# Patient Record
Sex: Male | Born: 1995 | Race: Black or African American | Hispanic: No | Marital: Single | Smoking: Never smoker
Health system: Southern US, Community
[De-identification: ages and names within clinical notes are randomized; demographics above are authoritative.]

---
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
## 2017-01-11 IMAGING — CR DG ANKLE COMPLETE 3+V*R*
3 series · 3 of 3 positions shown · non-contrast
Comparison: 12/20/2014

CLINICAL DATA: 19-year-old male with a history of right ankle
fracture previously.

EXAM:
RIGHT ANKLE - COMPLETE 3+ VIEW

[ankle ap]
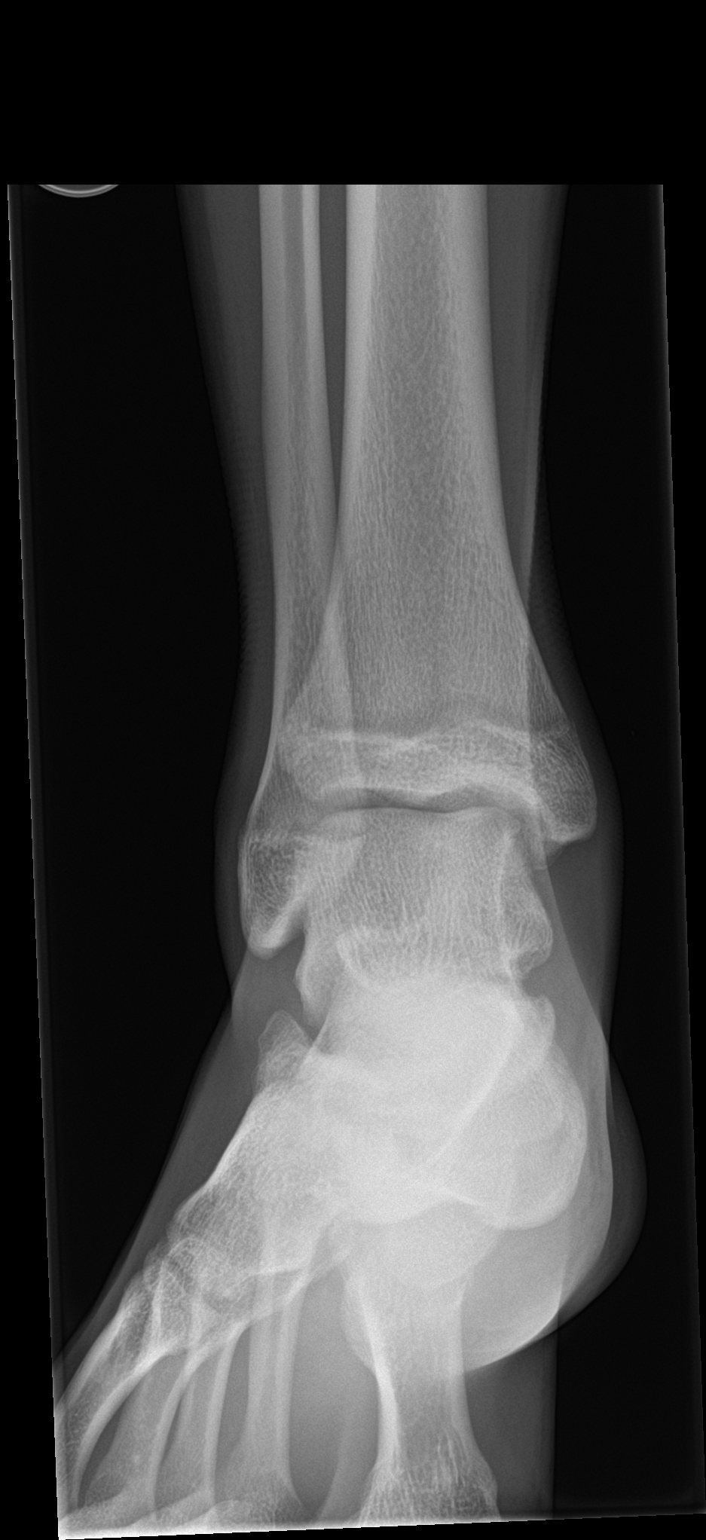

[ankle obl]
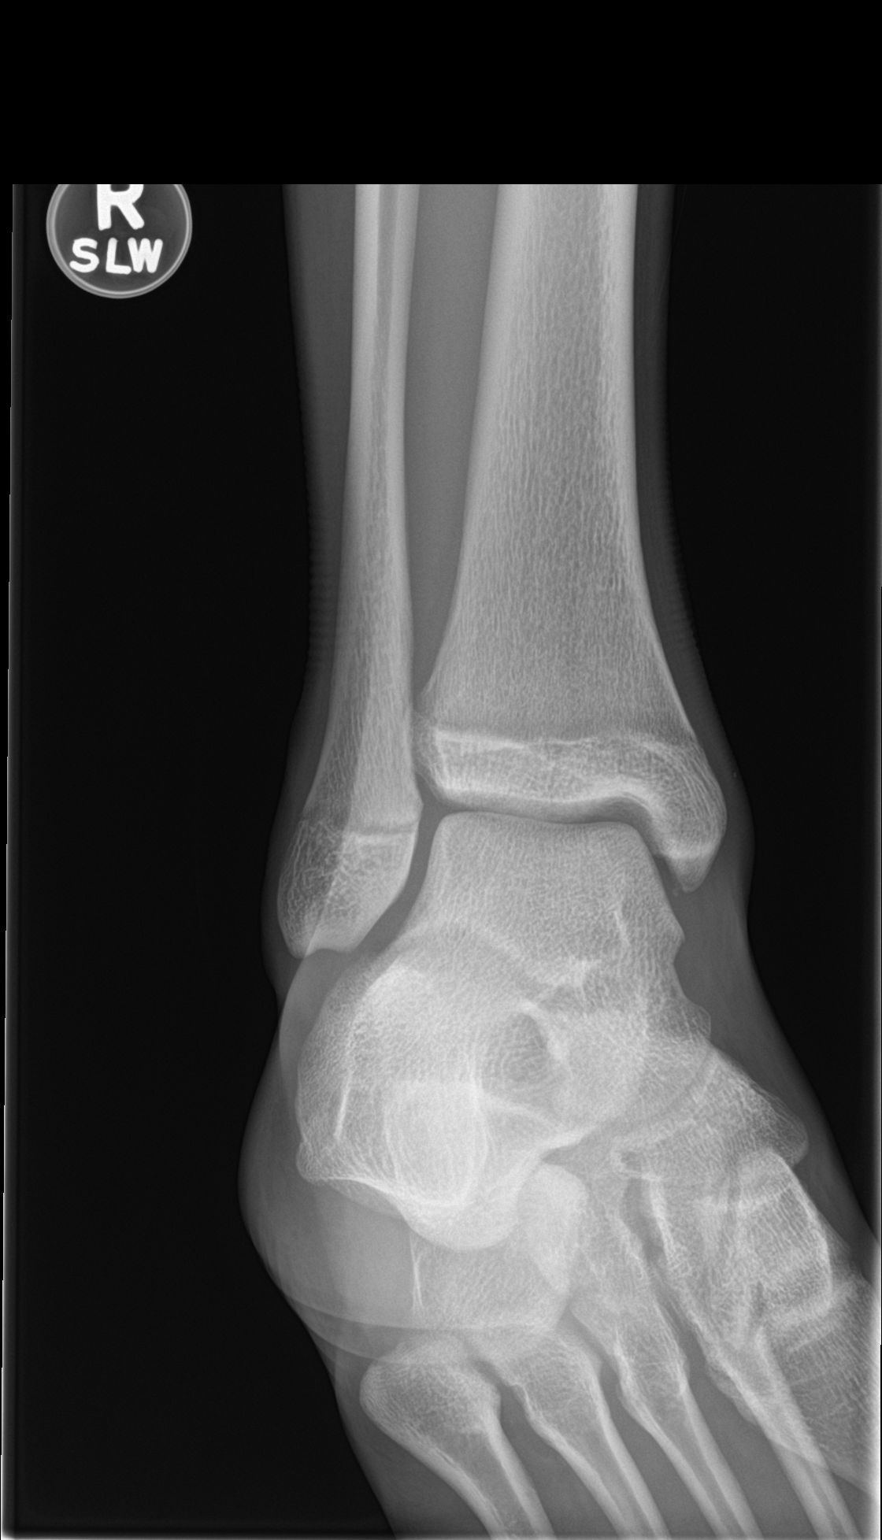

[ankle lat]
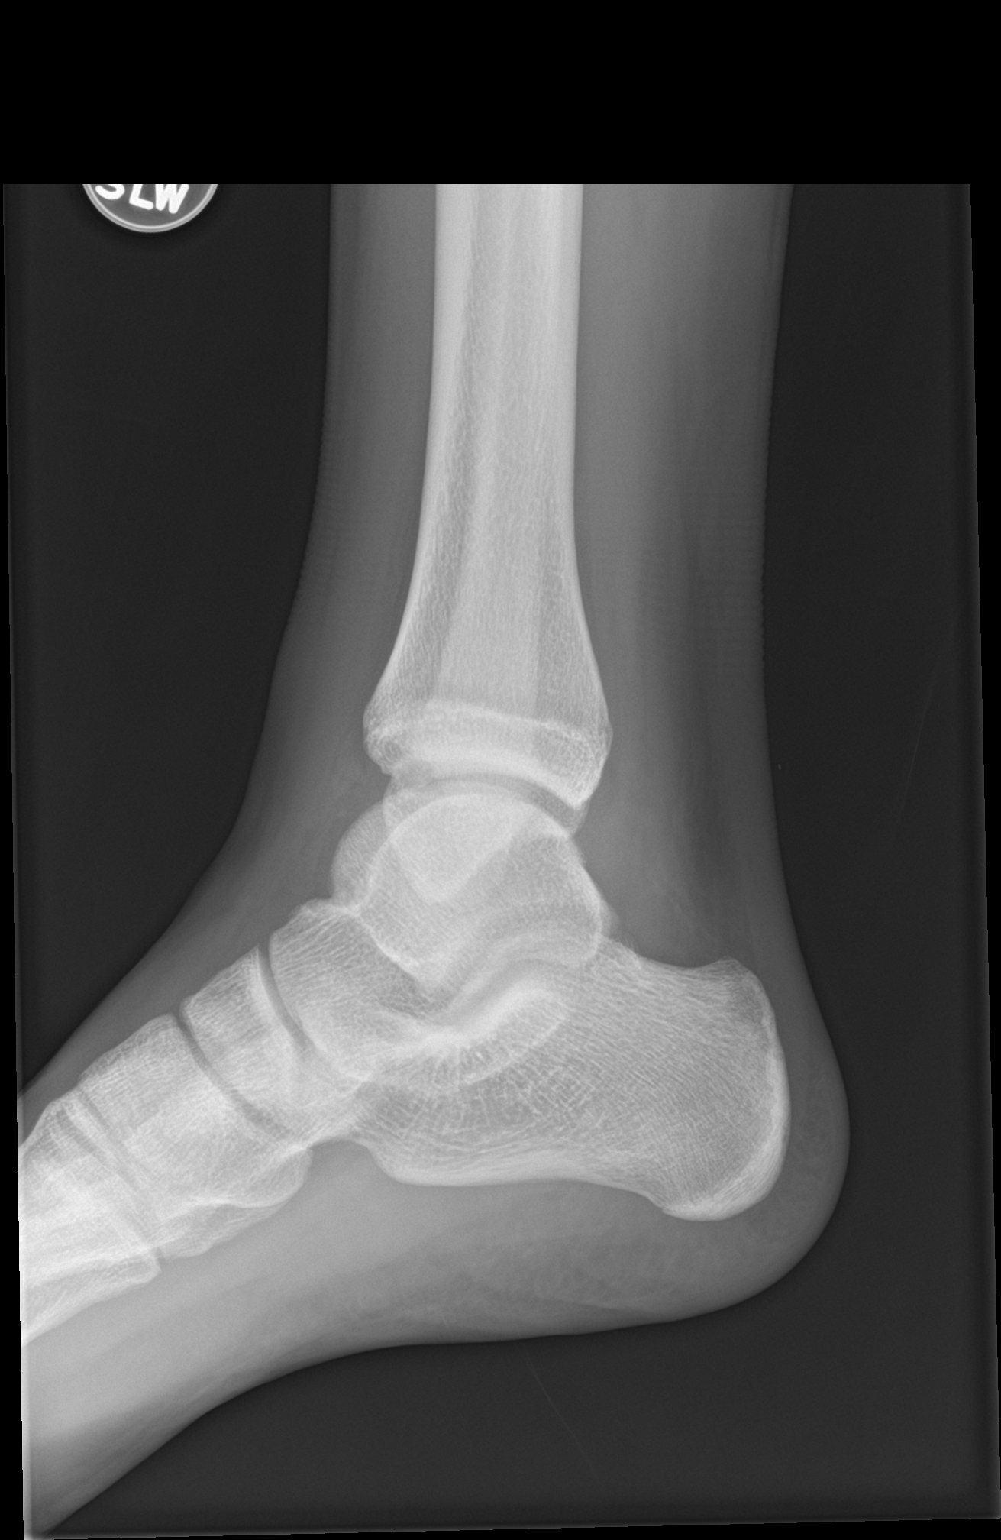

[3 of 3 positions shown; findings below may reference images not displayed]

FINDINGS: The small fracture fragments at the distal medial malleolus are
again visualized. Decreasing soft tissue swelling at the medial
malleolus. No new fracture identified.
IMPRESSION: Re- demonstration of small fracture fragments at the medial
malleolus with decreasing soft tissue swelling.

No new bony injury identified.
